# Patient Record
Sex: Female | Born: 1960 | Race: White | Hispanic: No | Marital: Married | Smoking: Never smoker
Health system: Southern US, Community
[De-identification: ages and names within clinical notes are randomized; demographics above are authoritative.]

## PROBLEM LIST (undated history)

## (undated) DIAGNOSIS — I10 Essential (primary) hypertension: Secondary | ICD-10-CM

## (undated) DIAGNOSIS — E079 Disorder of thyroid, unspecified: Secondary | ICD-10-CM

## (undated) DIAGNOSIS — F419 Anxiety disorder, unspecified: Secondary | ICD-10-CM

## (undated) HISTORY — PX: CHOLECYSTECTOMY: SHX55

## (undated) HISTORY — DX: Essential (primary) hypertension: I10

## (undated) HISTORY — DX: Anxiety disorder, unspecified: F41.9

## (undated) HISTORY — DX: Disorder of thyroid, unspecified: E07.9

---

## 2004-04-07 LAB — HM COLONOSCOPY: HM Colonoscopy: NORMAL

## 2004-10-05 ENCOUNTER — Ambulatory Visit: Payer: Self-pay | Admitting: General Surgery

## 2005-02-02 ENCOUNTER — Ambulatory Visit: Payer: Self-pay | Admitting: Family Medicine

## 2005-02-07 ENCOUNTER — Ambulatory Visit: Payer: Self-pay | Admitting: Family Medicine

## 2005-09-07 ENCOUNTER — Ambulatory Visit: Payer: Self-pay | Admitting: Family Medicine

## 2006-05-23 IMAGING — US US BREAST BILAT
1 series · 17 of 19 positions shown · non-contrast
Comparison: none

REASON FOR EXAM: Discharge
COMMENTS:

[Series 1: us breast bilat · 17 of 19 slices shown]
[im 1/19]
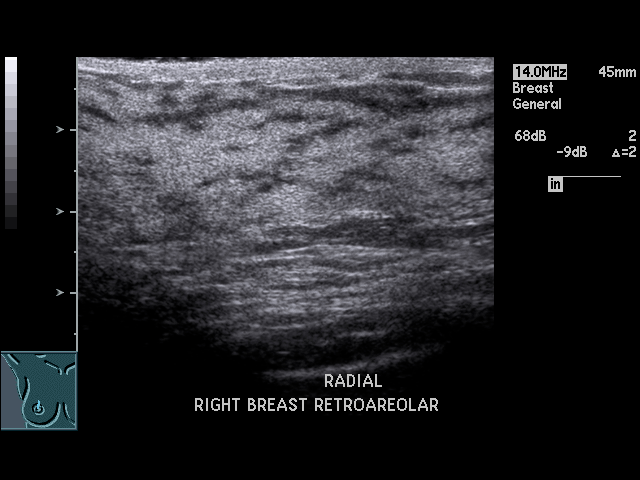
[im 2/19]
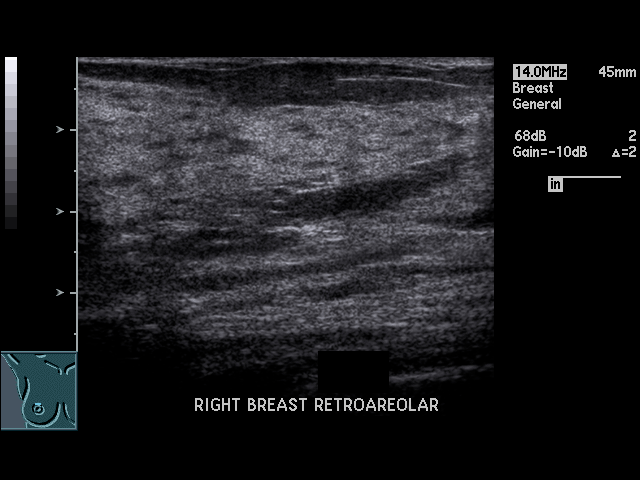
[im 3/19]
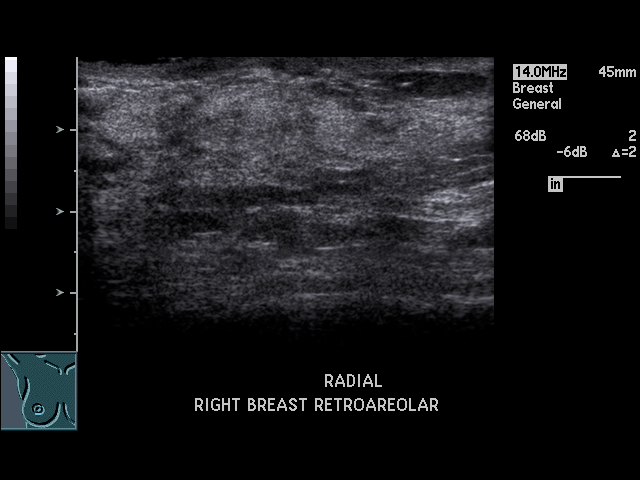
[im 4/19]
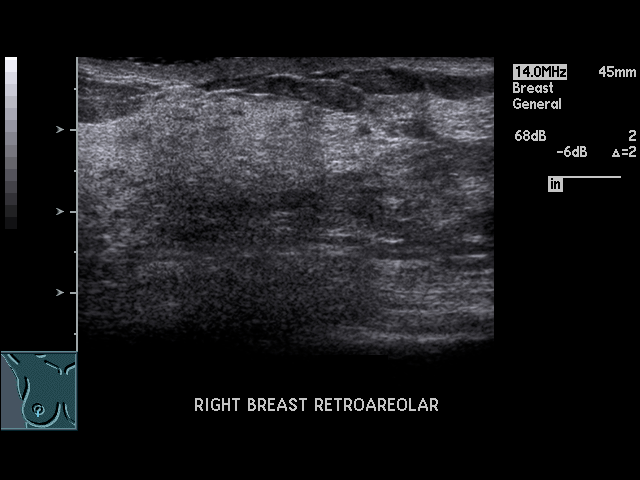
[im 6/19]
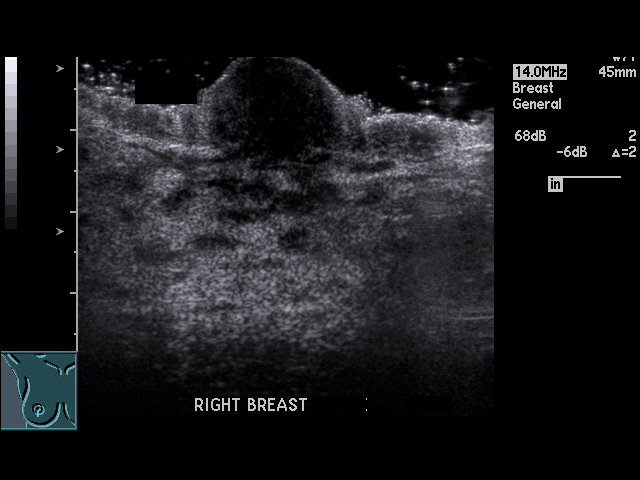
[im 7/19]
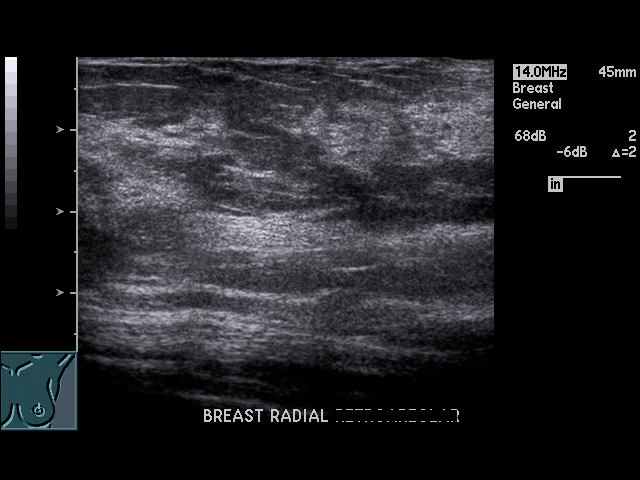
[im 8/19]
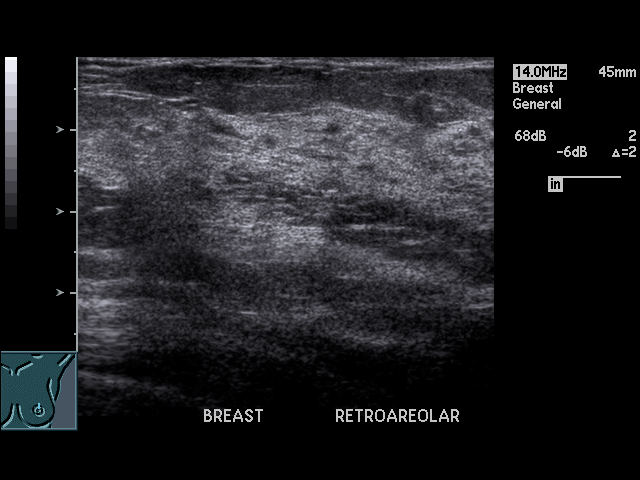
[im 9/19]
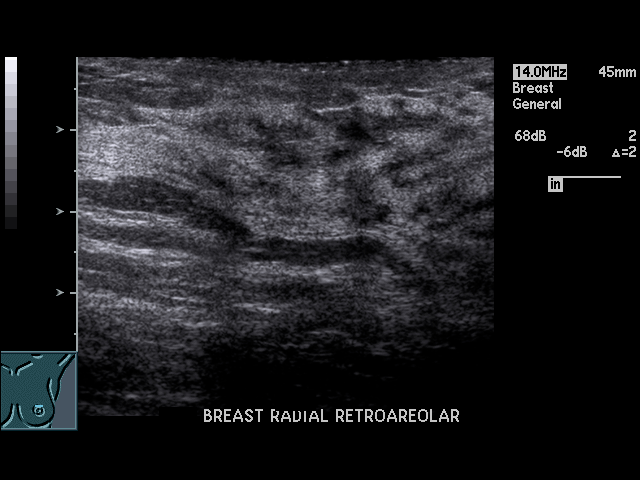
[im 10/19]
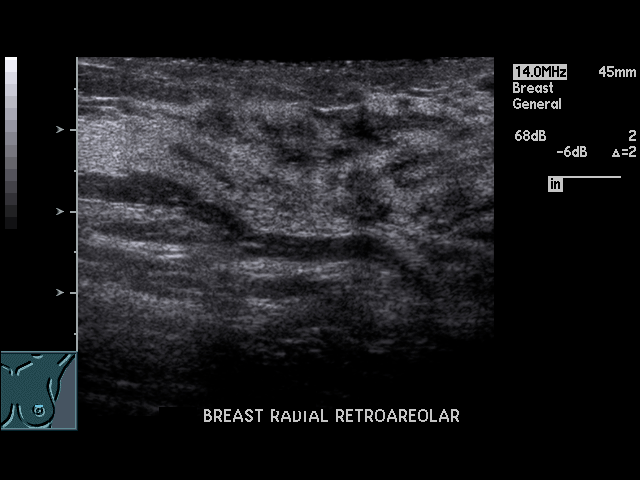
[im 11/19]
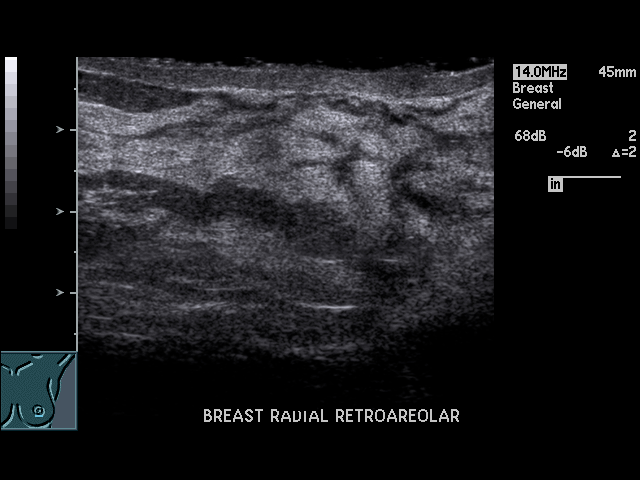
[im 12/19]
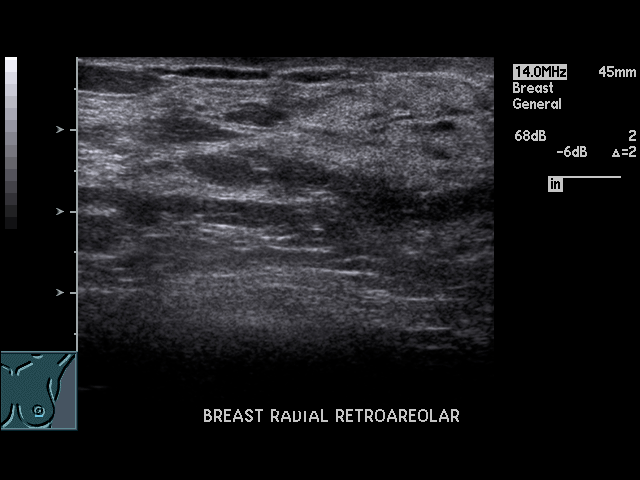
[im 13/19]
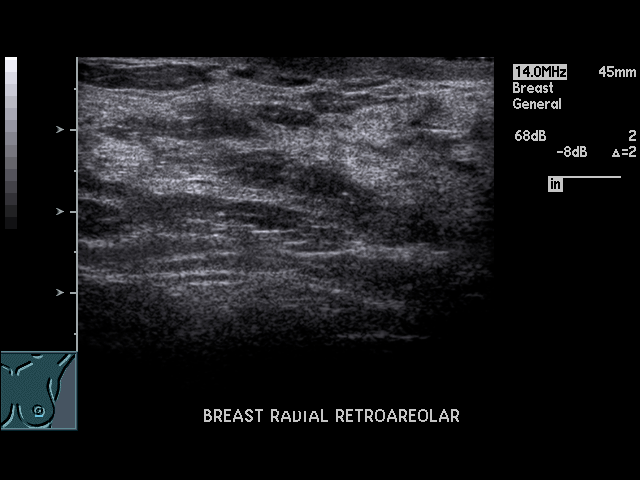
[im 14/19]
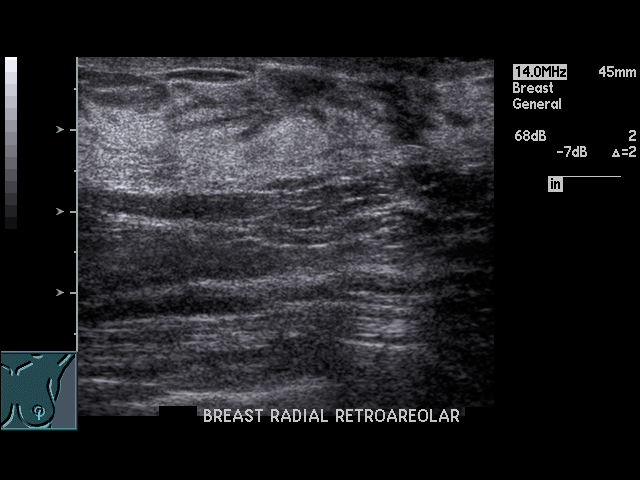
[im 16/19]
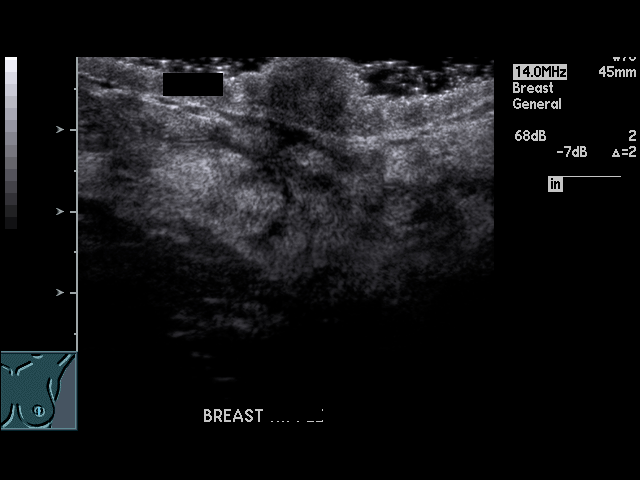
[im 17/19]
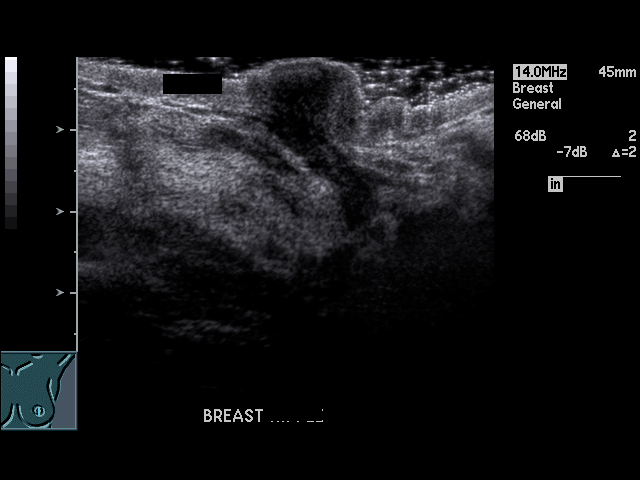
[im 18/19]
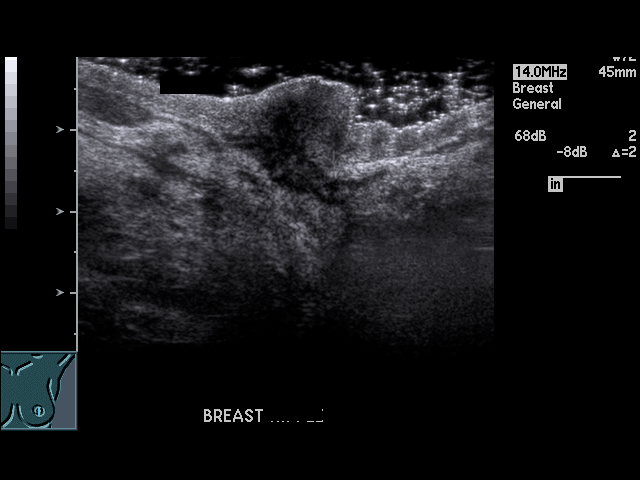
[im 19/19]
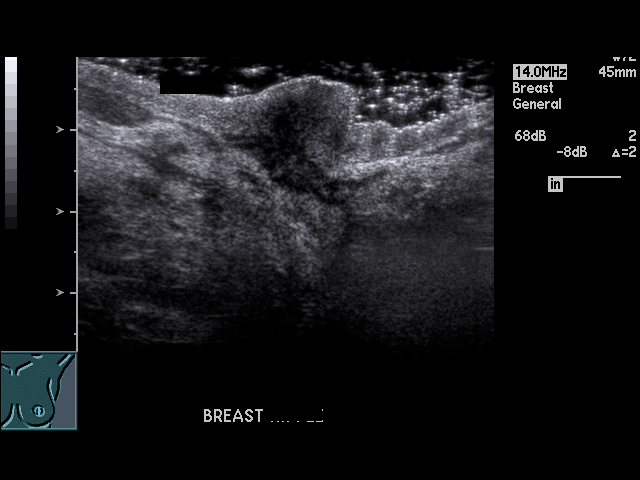

[17 of 19 positions shown; findings below may reference images not displayed]

PROCEDURE:     US  - US BREAST BILATERAL  - February 07, 2005  [DATE]

RESULT:        The patient's original mammogram of 02/02/05 did not show an
obvious mammographic abnormality.  However, she was complaining at that time
of bilateral gray/white nipple discharge.  Ultrasound examination was
recommended to exclude an underlying lesion.

Ultrasound evaluation of the retroareolar region of both breasts show no
evidence of a suspicious underlying mass.   There is no ductal dilatation.
IMPRESSION: No sonographic evidence of abnormality is noted.
The patient should return for bilateral mammograms in 6-months to ensure
stability.

BI-RADS: Category 3-Probably Benign Finding.

## 2006-09-19 ENCOUNTER — Ambulatory Visit: Payer: Self-pay | Admitting: Family Medicine

## 2008-03-06 ENCOUNTER — Ambulatory Visit: Payer: Self-pay | Admitting: Family Medicine

## 2009-07-27 LAB — HM PAP SMEAR: HM PAP: NEGATIVE

## 2009-09-09 ENCOUNTER — Ambulatory Visit: Payer: Self-pay | Admitting: Family Medicine

## 2010-12-08 ENCOUNTER — Ambulatory Visit: Payer: Self-pay | Admitting: Family Medicine

## 2010-12-08 LAB — HM MAMMOGRAPHY

## 2015-01-17 DIAGNOSIS — E038 Other specified hypothyroidism: Secondary | ICD-10-CM | POA: Insufficient documentation

## 2015-01-17 DIAGNOSIS — F4320 Adjustment disorder, unspecified: Secondary | ICD-10-CM | POA: Insufficient documentation

## 2015-01-17 DIAGNOSIS — I10 Essential (primary) hypertension: Secondary | ICD-10-CM | POA: Insufficient documentation

## 2015-01-17 DIAGNOSIS — F419 Anxiety disorder, unspecified: Secondary | ICD-10-CM | POA: Insufficient documentation

## 2015-01-17 DIAGNOSIS — E039 Hypothyroidism, unspecified: Secondary | ICD-10-CM | POA: Insufficient documentation

## 2015-01-20 ENCOUNTER — Telehealth: Payer: Self-pay | Admitting: Family Medicine

## 2015-01-20 NOTE — Telephone Encounter (Signed)
Pt is scheduled for 1130 appointment on 7/20 with Dr. Elease HashimotoMaloney. I called and left message with pt's daughter to call back. Dr. Elease HashimotoMaloney would like pt to come at 9:30 on 01/28/15. Thanks TNP

## 2015-01-21 NOTE — Telephone Encounter (Signed)
Pt called back and agreed to reschedule her appt to 9:30/MW

## 2015-01-28 ENCOUNTER — Telehealth: Payer: Self-pay | Admitting: Family Medicine

## 2015-01-28 ENCOUNTER — Encounter: Payer: Self-pay | Admitting: Family Medicine

## 2015-01-28 ENCOUNTER — Ambulatory Visit (INDEPENDENT_AMBULATORY_CARE_PROVIDER_SITE_OTHER): Payer: PRIVATE HEALTH INSURANCE | Admitting: Family Medicine

## 2015-01-28 VITALS — BP 114/72 | HR 64 | Temp 98.1°F | Resp 16 | Ht 68.0 in | Wt 182.0 lb

## 2015-01-28 DIAGNOSIS — F419 Anxiety disorder, unspecified: Secondary | ICD-10-CM

## 2015-01-28 DIAGNOSIS — I10 Essential (primary) hypertension: Secondary | ICD-10-CM | POA: Diagnosis not present

## 2015-01-28 MED ORDER — ESCITALOPRAM OXALATE 10 MG PO TABS: 10.0000 mg | ORAL_TABLET | Freq: Every day | ORAL | Status: DC

## 2015-01-28 MED ORDER — RAMIPRIL 5 MG PO CAPS: 5.0000 mg | ORAL_CAPSULE | Freq: Every day | ORAL | Status: DC

## 2015-01-28 NOTE — Progress Notes (Signed)
Subjective:    Patient ID: Whitney Rodgers, female    DOB: 01/12/1961, 54 y.o.   MRN: [id number]  Hypertension The problem is unchanged. The problem is controlled. Pertinent negatives include no blurred vision, chest pain, headaches, palpitations, peripheral edema or shortness of breath. There are no associated agents to hypertension. Risk factors for coronary artery disease include family history. The current treatment provides significant improvement. There are no compliance problems.    Anxiety: Patient complains of anxiety disorder.  She has the following symptoms: none. Onset of symptoms was approximately several years ago, stable since that time. She denies current suicidal and homicidal ideation. Family history significant for no psychiatric illness.Possible organic causes contributing are: none. Risk factors: none Previous treatment includes Lexapro.  She complains of the following side effects from the treatment: none.     Review of Systems  Constitutional: Negative.   Eyes: Negative for blurred vision.  Respiratory: Negative for shortness of breath.   Cardiovascular: Negative for chest pain and palpitations.  Musculoskeletal: Negative.   Neurological: Negative for headaches.  Psychiatric/Behavioral: Negative.     Patient Active Problem List   Diagnosis Date Noted  . Adaptation reaction 01/17/2015  . Anxiety 01/17/2015  . BP (high blood pressure) 01/17/2015  . 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 Anxiety   . Thyroid disease   . Hypertension    Current Outpatient Prescriptions on File Prior to Visit  Medication Sig  . escitalopram (LEXAPRO) 10 MG tablet Take 10 mg by mouth daily.    No current facility-administered medications on file prior to visit.   Allergies  Allergen Reactions  . Penicillins Rash   Past Surgical History  Procedure Laterality Date  . Cholecystectomy     History   Social History  . Marital  Status: Married    Spouse Name: Leonette MostCharles  . Number of Children: 2  . Years of Education: N/A   Occupational History  . retired    Social History Main Topics  . Smoking status: Never Smoker   . Smokeless tobacco: Never Used  . Alcohol Use: 1.2 oz/week    2 Glasses of wine per week  . Drug Use: No  . Sexual Activity: Not on file   Other Topics Concern  . Not on file   Social History Narrative   Family History  Problem Relation Age of Onset  . Hypertension Mother   . Cancer Father     Prostate  . Hyperlipidemia Father   . Hypertension Father   . Diabetes Father   . Healthy Brother        Objective:   Physical Exam  Constitutional: She is oriented to person, place, and time. She appears well-developed and well-nourished.  Cardiovascular: Normal rate and regular rhythm.   Pulmonary/Chest: Effort normal and breath sounds normal.  Neurological: She is alert and oriented to person, place, and time.  Psychiatric: She has a normal mood and affect. Her behavior is normal. Judgment and thought content normal.    BP 114/72 mmHg  Pulse 64  Temp(Src) 98.1 F (36.7 C) (Oral)  Resp 16  Ht 5\' 8"  (1.727 m)  Wt 182 lb (82.555 kg)  BMI 27.68 kg/m2  LMP  (LMP Unknown)      Assessment & Plan:  1. Essential hypertension Stable. Continue current medication and plan of care.   2. Anxiety Very stable. Refilled medication. Call if worsens or does not improve.   Lorie PhenixNancy Arthella Headings, MD

## 2015-01-28 NOTE — Telephone Encounter (Signed)
Pharmacy called needing to change amount for her RX's because [place].  Please call pharmacy back and confirm.  [phone] pharmacy #  Thanks Barth Kirkseri

## 2015-01-28 NOTE — Telephone Encounter (Signed)
Ok to fill to 180 to fill.

## 2015-07-03 ENCOUNTER — Other Ambulatory Visit: Payer: Self-pay

## 2015-07-03 ENCOUNTER — Other Ambulatory Visit: Payer: Self-pay | Admitting: Family Medicine

## 2015-07-03 DIAGNOSIS — I10 Essential (primary) hypertension: Secondary | ICD-10-CM

## 2015-07-03 DIAGNOSIS — F419 Anxiety disorder, unspecified: Secondary | ICD-10-CM

## 2015-07-03 MED ORDER — ESCITALOPRAM OXALATE 10 MG PO TABS: 10.0000 mg | ORAL_TABLET | Freq: Every day | ORAL | Status: DC

## 2015-07-03 MED ORDER — RAMIPRIL 5 MG PO CAPS: 5.0000 mg | ORAL_CAPSULE | Freq: Every day | ORAL | Status: DC

## 2015-07-03 NOTE — Telephone Encounter (Signed)
Patient's husband Leonette Most(Charles) is requesting refill for patient. He is requesting 180 tablets due to insurance. Thanks!

## 2015-07-08 ENCOUNTER — Other Ambulatory Visit: Payer: Self-pay | Admitting: Family Medicine

## 2015-07-08 DIAGNOSIS — I10 Essential (primary) hypertension: Secondary | ICD-10-CM

## 2015-07-08 DIAGNOSIS — F419 Anxiety disorder, unspecified: Secondary | ICD-10-CM

## 2015-07-08 MED ORDER — RAMIPRIL 5 MG PO CAPS: 5.0000 mg | ORAL_CAPSULE | Freq: Every day | ORAL | Status: DC

## 2015-07-08 MED ORDER — ESCITALOPRAM OXALATE 10 MG PO TABS: 10.0000 mg | ORAL_TABLET | Freq: Every day | ORAL | Status: DC

## 2015-07-08 NOTE — Telephone Encounter (Signed)
Pt's husband called stated they are on the road and there is no good number to contact them at, however pt's husband is requesting pt get a refill for her medications.  [facility] to [facility] phone number [phone].  ramipril (ALTACE) 5 MG capsule, escitalopram (LEXAPRO) 10 MG tablet  Pt is requesting a 180 day supply so that will last until they come back in the summer.  They do not [place] the states.

## 2015-07-09 ENCOUNTER — Telehealth: Payer: Self-pay | Admitting: Family Medicine

## 2016-01-26 ENCOUNTER — Encounter: Payer: Self-pay | Admitting: Family Medicine

## 2016-01-26 ENCOUNTER — Ambulatory Visit (INDEPENDENT_AMBULATORY_CARE_PROVIDER_SITE_OTHER): Payer: PRIVATE HEALTH INSURANCE | Admitting: Family Medicine

## 2016-01-26 VITALS — BP 134/84 | HR 64 | Temp 98.2°F | Resp 16 | Wt 186.0 lb

## 2016-01-26 DIAGNOSIS — I10 Essential (primary) hypertension: Secondary | ICD-10-CM

## 2016-01-26 DIAGNOSIS — F419 Anxiety disorder, unspecified: Secondary | ICD-10-CM

## 2016-01-26 MED ORDER — RAMIPRIL 5 MG PO CAPS: 5.0000 mg | ORAL_CAPSULE | Freq: Every day | ORAL | Status: DC

## 2016-01-26 MED ORDER — ESCITALOPRAM OXALATE 10 MG PO TABS: 10.0000 mg | ORAL_TABLET | Freq: Every day | ORAL | Status: DC

## 2016-01-26 NOTE — Progress Notes (Signed)
Subjective:     Patient ID: Whitney Rodgers, female   DOB: 07/29/1960, 55 y.o.   MRN: [id number]  HPI  Chief Complaint  Patient presents with  . Hypertension  . Anxiety  States she has felt well on medication. Returning to [place] on August first. 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   Review of Systems  Respiratory: Negative for shortness of breath.   Cardiovascular: Negative for chest pain and palpitations.  Gastrointestinal:       Reports transient burning in her right upper quadrant after meals. Hx of cholecystectomy.  Neurological: Negative for dizziness.       Objective:   Physical Exam  Constitutional: She appears well-developed and well-nourished. No distress.  Cardiovascular: Normal rate and regular rhythm.   Pulmonary/Chest: Breath sounds normal.  Musculoskeletal: She exhibits no edema (of lower extremities).       Assessment:    1. Essential hypertension - ramipril (ALTACE) 5 MG capsule; Take 1 capsule (5 mg total) by mouth daily.  Dispense: 180 capsule; Refill: 1  2. Anxiety - escitalopram (LEXAPRO) 10 MG tablet; Take 1 tablet (10 mg total) by mouth daily.  Dispense: 180 tablet; Refill: 1    Plan:    Update labs once in [place]. Encourage screening colonoscopy.

## 2016-01-26 NOTE — Patient Instructions (Signed)
Update labs when you get to [place]. Consider updating your colonoscopy.

## 2017-02-20 ENCOUNTER — Encounter: Payer: Self-pay | Admitting: Family Medicine

## 2017-02-20 ENCOUNTER — Ambulatory Visit (INDEPENDENT_AMBULATORY_CARE_PROVIDER_SITE_OTHER): Payer: 59 | Admitting: Family Medicine

## 2017-02-20 VITALS — BP 118/70 | HR 64 | Temp 98.3°F | Resp 16 | Wt 186.6 lb

## 2017-02-20 DIAGNOSIS — F419 Anxiety disorder, unspecified: Secondary | ICD-10-CM

## 2017-02-20 DIAGNOSIS — I10 Essential (primary) hypertension: Secondary | ICD-10-CM | POA: Diagnosis not present

## 2017-02-20 MED ORDER — RAMIPRIL 5 MG PO CAPS: 5.0000 mg | ORAL_CAPSULE | Freq: Every day | ORAL | 1 refills | Status: DC

## 2017-02-20 MED ORDER — ESCITALOPRAM OXALATE 10 MG PO TABS: 10.0000 mg | ORAL_TABLET | Freq: Every day | ORAL | 1 refills | Status: DC

## 2017-02-20 NOTE — Patient Instructions (Signed)
Continue with health maintenance in [place].

## 2017-02-20 NOTE — Progress Notes (Signed)
Subjective:     Patient ID: Whitney Rodgers, female   DOB: 10/13/1960, 56 y.o.   MRN: [id number]  HPI  Chief Complaint  Patient presents with  . Hypertension    Patient returns to office today for one year follow up, last office visit was 01/26/16 and blood pressure in house was 134/84. Patiet reports good compliance, tolerance and symptom control on Altace 5mg  qd.  . Anxiety    Patient returns for one year follow up last offive visit was 01/1816 patient was advised to continue Lexapro 10mg  qd. Patient reports that symptoms are improving, she reports good compliance and symptom control on medication.   States she will probably be staying in [place] for one more year. Reports health maintenance in [place] but did not bring lab results today.   Review of Systems  Respiratory: Negative for shortness of breath.   Cardiovascular: Negative for chest pain and palpitations.       Objective:   Physical Exam  Constitutional: She appears well-developed and well-nourished. No distress.  Cardiovascular: Normal rate and regular rhythm.   Pulmonary/Chest: Breath sounds normal.  Musculoskeletal: She exhibits no edema (of lower extremities).       Assessment:    1. Anxiety: controlled - escitalopram (LEXAPRO) 10 MG tablet; Take 1 tablet (10 mg total) by mouth daily.  Dispense: 180 tablet; Refill: 1  2. Essential hypertension: controlled - ramipril (ALTACE) 5 MG capsule; Take 1 capsule (5 mg total) by mouth daily.  Dispense: 180 capsule; Refill: 1    Plan:   F/u in one year. Continue health maintenance as reported.

## 2018-01-01 ENCOUNTER — Other Ambulatory Visit: Payer: Self-pay | Admitting: *Deleted

## 2018-01-01 DIAGNOSIS — F419 Anxiety disorder, unspecified: Secondary | ICD-10-CM

## 2018-01-01 DIAGNOSIS — I10 Essential (primary) hypertension: Secondary | ICD-10-CM

## 2018-01-01 MED ORDER — ESCITALOPRAM OXALATE 10 MG PO TABS: 10.0000 mg | ORAL_TABLET | Freq: Every day | ORAL | 1 refills | Status: DC

## 2018-01-01 MED ORDER — RAMIPRIL 5 MG PO CAPS: 5.0000 mg | ORAL_CAPSULE | Freq: Every day | ORAL | 1 refills | Status: DC

## 2019-01-09 ENCOUNTER — Other Ambulatory Visit: Payer: Self-pay

## 2019-01-09 DIAGNOSIS — I10 Essential (primary) hypertension: Secondary | ICD-10-CM

## 2019-01-09 DIAGNOSIS — F419 Anxiety disorder, unspecified: Secondary | ICD-10-CM

## 2019-01-09 NOTE — Telephone Encounter (Signed)
Patient is calling the office stating that she was a former patient of [facility] and has now moved to [facility] but has not set up with a PCP yet. Patient is requesting refills on Lexapro and Altace sent to [facility] ( pharmacy number is [phone]) Patient states that if you have any questions she can be reached at her home number. KW

## 2019-01-09 NOTE — Telephone Encounter (Signed)
Please advise 

## 2019-01-14 ENCOUNTER — Other Ambulatory Visit: Payer: Self-pay | Admitting: Family Medicine

## 2019-01-14 DIAGNOSIS — I10 Essential (primary) hypertension: Secondary | ICD-10-CM

## 2019-01-14 DIAGNOSIS — F419 Anxiety disorder, unspecified: Secondary | ICD-10-CM

## 2019-01-14 NOTE — Telephone Encounter (Signed)
She has not been seen in 2 years--she can have 2 weeks worth--she needs to see her new PCP before she runs out.

## 2019-01-14 NOTE — Telephone Encounter (Signed)
Please advise refill? 

## 2019-01-14 NOTE — Telephone Encounter (Signed)
Patient is requesting refills on Lexapro and Altace sent to [facility] ( pharmacy number is [phone]) Patient states that she has been out since Saturday and that they haven't had a chance to establish care with a PCP in their new location yet. Pt states that if you have any questions she can be reached at her home number.

## 2019-01-15 MED ORDER — ESCITALOPRAM OXALATE 10 MG PO TABS: 10.0000 mg | ORAL_TABLET | Freq: Every day | ORAL | 1 refills | Status: AC

## 2019-01-15 MED ORDER — RAMIPRIL 5 MG PO CAPS: 5.0000 mg | ORAL_CAPSULE | Freq: Every day | ORAL | 0 refills | Status: AC
